# Patient Record
Sex: Male | Born: 1975 | Race: White | Hispanic: No | Marital: Married | Smoking: Never smoker
Health system: Southern US, Community
[De-identification: ages and names within clinical notes are randomized; demographics above are authoritative.]

---

## 2018-09-26 ENCOUNTER — Encounter (HOSPITAL_COMMUNITY): Payer: Self-pay | Admitting: Emergency Medicine

## 2018-09-26 ENCOUNTER — Emergency Department (HOSPITAL_COMMUNITY)
Admission: EM | Admit: 2018-09-26 | Discharge: 2018-09-26 | Disposition: A | Discharge status: Home / Self Care | Payer: 59 | Attending: Emergency Medicine | Admitting: Emergency Medicine

## 2018-09-26 ENCOUNTER — Other Ambulatory Visit: Payer: Self-pay

## 2018-09-26 ENCOUNTER — Emergency Department (HOSPITAL_COMMUNITY): Payer: 59

## 2018-09-26 DIAGNOSIS — R3911 Hesitancy of micturition: Secondary | ICD-10-CM | POA: Diagnosis not present

## 2018-09-26 DIAGNOSIS — Z79899 Other long term (current) drug therapy: Secondary | ICD-10-CM | POA: Diagnosis not present

## 2018-09-26 DIAGNOSIS — R110 Nausea: Secondary | ICD-10-CM | POA: Diagnosis not present

## 2018-09-26 DIAGNOSIS — Z91010 Allergy to peanuts: Secondary | ICD-10-CM | POA: Diagnosis not present

## 2018-09-26 DIAGNOSIS — R197 Diarrhea, unspecified: Secondary | ICD-10-CM | POA: Diagnosis not present

## 2018-09-26 DIAGNOSIS — R1030 Lower abdominal pain, unspecified: Secondary | ICD-10-CM | POA: Diagnosis present

## 2018-09-26 LAB — CBC WITH DIFFERENTIAL/PLATELET
Abs Immature Granulocytes: 0.05 10*3/uL (ref 0.00–0.07)
Basophils Absolute: 0.1 10*3/uL (ref 0.0–0.1)
Basophils Relative: 1 %
Eosinophils Absolute: 0.2 10*3/uL (ref 0.0–0.5)
Eosinophils Relative: 3 %
HCT: 51.6 % (ref 39.0–52.0)
Hemoglobin: 17.7 g/dL — ABNORMAL HIGH (ref 13.0–17.0)
Immature Granulocytes: 1 %
Lymphocytes Relative: 33 %
Lymphs Abs: 2.6 10*3/uL (ref 0.7–4.0)
MCH: 30.9 pg (ref 26.0–34.0)
MCHC: 34.3 g/dL (ref 30.0–36.0)
MCV: 90.2 fL (ref 80.0–100.0)
Monocytes Absolute: 0.7 10*3/uL (ref 0.1–1.0)
Monocytes Relative: 8 %
Neutro Abs: 4.3 10*3/uL (ref 1.7–7.7)
Neutrophils Relative %: 54 %
Platelets: 214 10*3/uL (ref 150–400)
RBC: 5.72 MIL/uL (ref 4.22–5.81)
RDW: 12.3 % (ref 11.5–15.5)
WBC: 7.9 10*3/uL (ref 4.0–10.5)
nRBC: 0 % (ref 0.0–0.2)

## 2018-09-26 LAB — COMPREHENSIVE METABOLIC PANEL
ALT: 36 U/L (ref 0–44)
AST: 27 U/L (ref 15–41)
Albumin: 4.3 g/dL (ref 3.5–5.0)
Alkaline Phosphatase: 81 U/L (ref 38–126)
Anion gap: 11 (ref 5–15)
BUN: 10 mg/dL (ref 6–20)
CO2: 22 mmol/L (ref 22–32)
Calcium: 9.6 mg/dL (ref 8.9–10.3)
Chloride: 107 mmol/L (ref 98–111)
Creatinine, Ser: 0.89 mg/dL (ref 0.61–1.24)
GFR calc Af Amer: >60 mL/min (ref >60)
GFR calc non Af Amer: >60 mL/min (ref >60)
Glucose, Bld: 111 mg/dL — ABNORMAL HIGH (ref 70–99)
Potassium: 4.4 mmol/L (ref 3.5–5.1)
Sodium: 140 mmol/L (ref 135–145)
Total Bilirubin: 0.6 mg/dL (ref 0.3–1.2)
Total Protein: 7.2 g/dL (ref 6.5–8.1)

## 2018-09-26 LAB — URINALYSIS, ROUTINE W REFLEX MICROSCOPIC
Bilirubin Urine: NEGATIVE
Glucose, UA: NEGATIVE mg/dL
Hgb urine dipstick: NEGATIVE
Ketones, ur: NEGATIVE mg/dL
Leukocytes,Ua: NEGATIVE
Nitrite: NEGATIVE
Protein, ur: NEGATIVE mg/dL
Specific Gravity, Urine: 1.011 (ref 1.005–1.030)
pH: 8 (ref 5.0–8.0)

## 2018-09-26 LAB — LIPASE, BLOOD: Lipase: 46 U/L (ref 11–51)

## 2018-09-26 MED ORDER — ONDANSETRON HCL 4 MG/2ML IJ SOLN
4.0000 mg | Freq: Once | INTRAMUSCULAR | Status: AC
  Administered 2018-09-26: 10:00:00 4 mg via INTRAVENOUS
  Filled 2018-09-26: qty 2

## 2018-09-26 MED ORDER — SODIUM CHLORIDE 0.9 % IV BOLUS
1000.0000 mL | Freq: Once | INTRAVENOUS | Status: AC
  Administered 2018-09-26: 1000 mL via INTRAVENOUS

## 2018-09-26 MED ORDER — TAMSULOSIN HCL 0.4 MG PO CAPS: 0.4000 mg | ORAL_CAPSULE | Freq: Every day | ORAL | 0 refills | Status: AC

## 2018-09-26 MED ORDER — KETOROLAC TROMETHAMINE 30 MG/ML IJ SOLN
30.0000 mg | Freq: Once | INTRAMUSCULAR | Status: AC
  Administered 2018-09-26: 30 mg via INTRAVENOUS
  Filled 2018-09-26: qty 1

## 2018-09-26 NOTE — ED Provider Notes (Signed)
[facility] Provider Note   CSN: [id number] Arrival date & time: 09/26/18  [id number]    History   Chief Complaint Chief Complaint  Patient presents with  . Abdominal Pain    HPI Jimmy Vargas is a 43 y.o. male with no significant past medical history presents with due intermittent sharp non radiating lower abdominal pain radiating to right flank onset today at 4am. Patient reports pain woke him up from his sleep. Patient reports he has had similar intermittent episodes over the last 3 months and symptoms typically resolve on their own. Patient reports associated nausea, but denies vomiting. Patient states nothing makes symptoms better or worse. Patient denies taking any medications prior to arrival. Patient reports 1 episode of brown diarrhea. Patient denies fever, chills, cough, shortness of breath, chest pain, or back pain. Patient reports hesitancy with urination at times.  Patient denies dysuria, frequency, or hematuria. Patient reports occasional alcohol use, but denies tobacco or drug use. Patient reports he has had an appendectomy in the past.      HPI  History reviewed. No pertinent past medical history.  There are no active problems to display for this patient.        Home Medications    Prior to Admission medications   Medication Sig Start Date End Date Taking? Authorizing Provider  acetaminophen (TYLENOL) 325 MG tablet Take 650 mg by mouth every 6 (six) hours as needed for mild pain or headache (hip pain).   Yes [provider]  ibuprofen (ADVIL) 200 MG tablet Take 400 mg by mouth every 6 (six) hours as needed for headache or moderate pain (hip pain).   Yes [provider]  Multiple Vitamin (MULTIVITAMIN WITH MINERALS) TABS tablet Take 1 tablet by mouth daily.   Yes [provider]  tamsulosin (FLOMAX) 0.4 MG CAPS capsule Take 1 capsule (0.4 mg total) by mouth daily. 09/26/18   Leretha DykesHernandez, Amelia Burgard P, PA-C     Family History No family history on file.  Social History Social History   Tobacco Use  . Smoking status: Never Smoker  Substance Use Topics  . Alcohol use: Yes    Alcohol/week: 9.0 standard drinks    Types: 9 Glasses of wine per week    Comment: 3 glasses X 3-4/wk  . Drug use: Never     Allergies   Nitrates, organic; Peanut-containing drug products; and Vicodin [hydrocodone-acetaminophen]   Review of Systems Review of Systems  Constitutional: Negative for activity change, appetite change, chills, fever and unexpected weight change.  HENT: Negative for congestion, rhinorrhea and sore throat.   Eyes: Negative for visual disturbance.  Respiratory: Negative for cough and shortness of breath.   Cardiovascular: Negative for chest pain.  Gastrointestinal: Positive for abdominal pain, diarrhea and nausea. Negative for constipation and vomiting.  Endocrine: Negative for polydipsia, polyphagia and polyuria.  Genitourinary: Positive for difficulty urinating. Negative for dysuria, flank pain and frequency.  Musculoskeletal: Negative for back pain.  Skin: Negative for rash.  Allergic/Immunologic: Negative for immunocompromised state.  Psychiatric/Behavioral: The patient is not nervous/anxious.     Physical Exam Updated Vital Signs BP 122/88   Pulse (!) 55   Temp 98 F (36.7 C) (Oral)   Resp 19   Ht 6\' 2"  (1.88 m)   Wt 96.6 kg   SpO2 99%   BMI 27.35 kg/m   Physical Exam Vitals signs and nursing note reviewed.  Constitutional:      General: He is not in acute distress.  Appearance: He is well-developed. He is not diaphoretic.  HENT:     Head: Normocephalic and atraumatic.     Mouth/Throat:     Mouth: Mucous membranes are moist.     Pharynx: No pharyngeal swelling or oropharyngeal exudate.  Eyes:     General: No scleral icterus. Neck:     Musculoskeletal: Normal range of motion and neck supple.  Cardiovascular:     Rate and Rhythm: Normal rate and regular rhythm.      Heart sounds: Normal heart sounds. No murmur. No friction rub. No gallop.   Pulmonary:     Effort: Pulmonary effort is normal. No respiratory distress.     Breath sounds: Normal breath sounds. No wheezing or rales.  Abdominal:     General: Bowel sounds are normal. There is no distension.     Palpations: Abdomen is soft. Abdomen is not rigid. There is no mass.     Tenderness: There is abdominal tenderness in the right lower quadrant and suprapubic area. There is no right CVA tenderness, left CVA tenderness, guarding or rebound.     Hernia: No hernia is present.  Musculoskeletal: Normal range of motion.  Skin:    Findings: No rash.  Neurological:     Mental Status: He is alert and oriented to person, place, and time.    ED Treatments / Results  Labs (all labs ordered are listed, but only abnormal results are displayed) Labs Reviewed  CBC WITH DIFFERENTIAL/PLATELET - Abnormal; Notable for the following components:      Result Value   Hemoglobin 17.7 (*)    All other components within normal limits  COMPREHENSIVE METABOLIC PANEL - Abnormal; Notable for the following components:   Glucose, Bld 111 (*)    All other components within normal limits  LIPASE, BLOOD  URINALYSIS, ROUTINE W REFLEX MICROSCOPIC    EKG None  Radiology Ct Renal Stone Study  Result Date: 09/26/2018 CLINICAL DATA:  Lower abdominal/flank pain EXAM: CT ABDOMEN AND PELVIS WITHOUT CONTRAST TECHNIQUE: Multidetector CT imaging of the abdomen and pelvis was performed following the standard protocol without oral or IV contrast. COMPARISON:  None. FINDINGS: Lower chest: Lung bases are clear. Hepatobiliary: There are scattered subcentimeter cysts in the liver. There is an apparent cyst in the left lobe of the liver measuring 1.4 x 1.1 cm. Gallbladder wall is not appreciably thickened. There is no appreciable biliary duct dilatation. Pancreas: There is no pancreatic mass or inflammatory focus. Spleen: No splenic lesions  are evident. Adrenals/Urinary Tract: Adrenals bilaterally appear unremarkable. Kidneys bilaterally show no evident mass or hydronephrosis on either side. There is a 1 mm calculus in the mid right kidney anteriorly with a second 1 mm calculus more posteriorly in the mid right kidney. There is no evident ureteral calculus on either side. Urinary bladder is midline with wall thickness within normal limits. Stomach/Bowel: There are scattered diverticula throughout the colon without diverticulitis. There is no appreciable bowel wall or mesenteric thickening. There is no evident bowel obstruction. Terminal ileum appears unremarkable. There is no demonstrable free air or portal venous air. Vascular/Lymphatic: There is no appreciable abdominal aortic aneurysm. No vascular lesions are evident on this noncontrast enhanced study. There is no appreciable adenopathy in the abdomen or pelvis. Reproductive: There are small prostatic calculi. The prostate and seminal vesicles are normal in size and contour. No pelvic mass evident. Other: Appendix not appreciable. There is no periappendiceal region inflammation. There is no abscess or ascites in the abdomen or pelvis. There is  thinning of the rectus muscle in the midline at the umbilical level. Musculoskeletal: No blastic or lytic bone lesions. No intramuscular lesions evident. IMPRESSION: 1. 1 mm calculi in the mid right kidney. No hydronephrosis or ureteral calculus on either side. Urinary bladder wall thickness normal. Several small prostatic calculi noted. 2. Multiple colonic diverticula without diverticulitis. No bowel obstruction. No abscess in the abdomen pelvis. No periappendiceal region inflammation. Appendix not appreciable. Electronically Signed   By: Lowella Grip III M.D.   On: 09/26/2018 11:31    Procedures Procedures (including critical care time)  Medications Ordered in ED Medications  ondansetron (ZOFRAN) injection 4 mg (4 mg Intravenous Given 09/26/18  0942)  sodium chloride 0.9 % bolus 1,000 mL (1,000 mLs Intravenous New Bag/Given 09/26/18 0942)  ketorolac (TORADOL) 30 MG/ML injection 30 mg (30 mg Intravenous Given 09/26/18 0942)    Initial Impression / Assessment and Plan / ED Course  I have reviewed the triage vital signs and the nursing notes.  Pertinent labs & imaging results that were available during my care of the patient were reviewed by me and considered in my medical decision making (see chart for details).  Clinical Course as of Sep 26 1207  Thu Sep 26, 2018  0959 WBCs are within normal limits.  WBC: 7.9 [AH]  0959 UA is unremarkable.  Urinalysis, Routine w reflex microscopic [AH]  1035 CMP within normal limits except mild hyperglycemia noted at 111.   Comprehensive metabolic panel(!) [AH]  0354 Lipase within normal limits.  Lipase, blood [AH]  1139 1. 1 mm calculi in the mid right kidney. No hydronephrosis or ureteral calculus on either side. Urinary bladder wall thickness normal. Several small prostatic calculi noted.  2. Multiple colonic diverticula without diverticulitis. No bowel obstruction. No abscess in the abdomen pelvis. No periappendiceal region inflammation. Appendix not appreciable.    CT Renal Stone Study [AH]  1157 Patient reports symptoms have significantly improved while in the ER.    [AH]    Clinical Course User Index [AH] Arville Lime, PA-C      Patient is nontoxic, nonseptic appearing, in no apparent distress.  Patient's pain and other symptoms adequately managed in emergency department.  Fluid bolus, pain medicine, and antiemetics given.  Labs, imaging and vitals reviewed.  Patient does not meet the SIRS or Sepsis criteria.  On repeat exam patient does not have a surgical abdomin and there are no peritoneal signs.  CT stone study reveals 98mm calculi in mid right kidney without hydronephrosis or ureteral calculus. Several small prostatic calculi noted. No indication of appendicitis, bowel  obstruction, bowel perforation, cholecystitis, or diverticulitis. Symptoms have resolved while in the ER. Patient discharged home with symptomatic treatment and given strict instructions for follow-up with their primary care physician and/or urology. Patient refused pain medicine or antiemetics for discharge. I have also discussed reasons to return immediately to the ER.  Patient expresses understanding and agrees with plan.  Final Clinical Impressions(s) / ED Diagnoses   Final diagnoses:  Lower abdominal pain    ED Discharge Orders         Ordered    tamsulosin (FLOMAX) 0.4 MG CAPS capsule  Daily     09/26/18 1206           Darlin Drop Martell, [place] 09/26/18 1210    Noemi Chapel, MD 09/29/18 1531

## 2018-09-26 NOTE — ED Triage Notes (Signed)
PT reports lower abdominal pain that about once a month X3 months. He reports the pain starts at his lower-right quad and then the pain goes into his lower abdomin.  Reports nausea, denies vomiting.

## 2018-09-26 NOTE — Discharge Instructions (Addendum)
You have been seen today for abdominal pain. Please read and follow all provided instructions.   1. Medications: flomax, tylenol/ibuprofen for pain, usual home medications 2. Treatment: rest, drink plenty of fluids 3. Follow Up: Please follow up with your primary doctor in 2-5 days for discussion of your diagnoses and further evaluation after today's visit; if you do not have a primary care doctor use the resource guide provided to find one; Please return to the ER for any new or worsening symptoms. Please obtain all of your results from medical records or have your doctors office obtain the results - share them with your doctor - you should be seen at your doctors office. Call today to arrange your follow up.   Take medications as prescribed. Please review all of the medicines and only take them if you do not have an allergy to them. Return to the emergency room for worsening condition or new concerning symptoms. Follow up with your regular doctor. If you don't have a regular doctor use one of the numbers below to establish a primary care doctor.  Please be aware that if you are taking birth control pills, taking other prescriptions, ESPECIALLY ANTIBIOTICS may make the birth control ineffective - if this is the case, either do not engage in sexual activity or use alternative methods of birth control such as condoms until you have finished the medicine and your family doctor says it is OK to restart them. If you are on a blood thinner such as COUMADIN, be aware that any other medicine that you take may cause the coumadin to either work too much, or not enough - you should have your coumadin level rechecked in next 7 days if this is the case.  ?  It is also a possibility that you have an allergic reaction to any of the medicines that you have been prescribed - Everybody reacts differently to medications and while MOST people have no trouble with most medicines, you may have a reaction such as nausea,  vomiting, rash, swelling, shortness of breath. If this is the case, please stop taking the medicine immediately and contact your physician.  ?  You should return to the ER if you develop severe or worsening symptoms.   Emergency Department Resource Guide 1) Find a Doctor and Pay Out of Pocket Although you won't have to find out who is covered by your insurance plan, it is a good idea to ask around and get recommendations. You will then need to call the office and see if the doctor you have chosen will accept you as a new patient and what types of options they offer for patients who are self-pay. Some doctors offer discounts or will set up payment plans for their patients who do not have insurance, but you will need to ask so you aren't surprised when you get to your appointment.  2) Contact Your Local Health Department Not all health departments have doctors that can see patients for sick visits, but many do, so it is worth a call to see if yours does. If you don't know where your local health department is, you can check in your phone book. The CDC also has a tool to help you locate your state's health department, and many state websites also have listings of all of their local health departments.  3) Find a [facility] If your illness is not likely to be very severe or complicated, you may want to try a walk in clinic. These are  popping up all over the country in pharmacies, drugstores, and shopping centers. They're usually staffed by nurse practitioners or physician assistants that have been trained to treat common illnesses and complaints. They're usually fairly quick and inexpensive. However, if you have serious medical issues or chronic medical problems, these are probably not your best option.  No Primary Care Doctor: Call [facility] Connect at  [phone] - they can help you locate a primary care doctor that  accepts your insurance, provides certain services, etc. Physician Referral Service-  [phone]  Chronic Pain Problems: Organization         Address  Phone   Notes  [facility]  [phone] Patients need to be referred by their primary care doctor.   Medication Assistance: Organization         Address  Phone   Notes  [facility] Medication [facility] [address] [place], [place] [address] [phone] --Must be a resident of [facility] -- Must have NO insurance coverage whatsoever (no Medicaid/ Medicare, etc.) -- The pt. MUST have a primary care doctor that directs their care regularly and follows them in the community   [facility]  [phone]   [employer]  [phone]    Agencies that provide inexpensive medical care: Organization         Address  Phone   Notes  [place]  [phone]   [facility]    [phone]   [facility] [place], [place] [address] [phone]   [place] [address], [place] [phone]   [facility]    [phone]   [facility]    [phone]   [facility] [address], [place] Phone:  [phone], Fax:  [phone] Hours of Operation:  9 am - 6 pm, M-F.  Also accepts Medicaid/Medicare and self-pay.  [facility] for [place] [address], [address], [place] Phone: [phone], Fax: [phone]. Hours of Operation:  8:30 am - 5:30 pm, M-F.  Also accepts Medicaid and self-pay.  [facility] [address] [place] Phone: [phone]   [place], [place], [place] [phone], Ext. [phone] Mondays & Thursdays: 7-9 AM.  First 15 patients are seen on a first come, first serve basis.    [place] Providers:  Organization         Address  Phone   Notes  [facility] [address],  [place] [phone] Also accepts self-pay patients.  [facility] [address], [place]  [phone]   [place], [address], [place] [phone]   [facility] [address] [place] [phone]   Lucianne Lei [address], [place]   [phone] Only accepts [place] patients after they have their name applied to their card.   Self-Pay (no insurance) in [facility]:  Organization         Address  Phone   Notes  Sickle Cell Patients, [facility] [place] [phone]   [facility] [place] [phone]   [facility]  [address] [place]  [address], [place] [phone]   [facility]/Dr. Osei-Bonsu  [address], [place] or [address], [place] [phone] Phone number for both [place] and [place] locations is the same.  [facility] [address], [place] [phone]   [facility] [address] [place] or [address] [phone]   [facility] [address] [place] [phone], phone; [phone], fax Sees patients 1st and 3rd Saturday of every month.  Must not qualify for public or private insurance (i.e. Medicaid, Medicare, [place] Health Choice, Veterans' Benefits)  Household income should be no more than 200% of the poverty level The clinic cannot treat you if you are pregnant or think you are pregnant  Sexually transmitted diseases are not treated at the clinic.

## 2018-09-26 NOTE — ED Notes (Signed)
Patient verbalizes understanding of discharge instructions. Opportunity for questioning and answers were provided. Armband removed by staff, pt discharged from ED.  

## 2020-10-08 IMAGING — CT CT RENAL STONE PROTOCOL
2 of 4 series · 15 of 46 positions shown, 17 images · non-contrast
Comparison: None.

CLINICAL DATA: Lower abdominal/flank pain

EXAM:
CT ABDOMEN AND PELVIS WITHOUT CONTRAST
TECHNIQUE: Multidetector CT imaging of the abdomen and pelvis was performed
following the standard protocol without oral or IV contrast.

[Series 3: renal stone 5.0 · axial · 0.96mm/px · z∈[+688,+1223]mm · 12 of 117 slices shown, 14 images]
[im 5/117  soft-tissue]
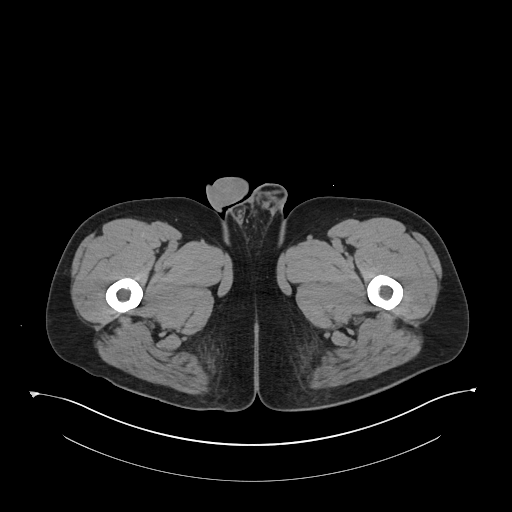
[im 5/117  bone]
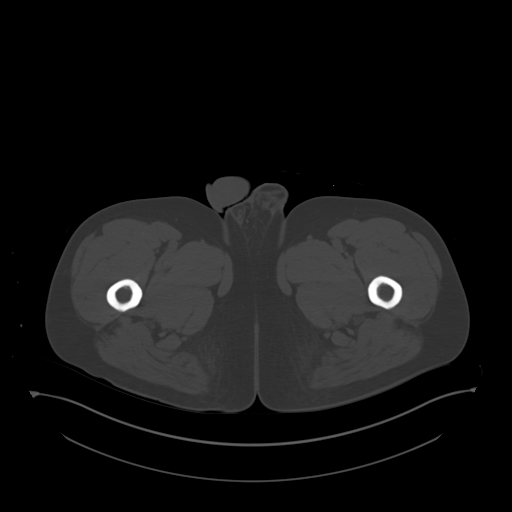
[im 15/117  soft-tissue]
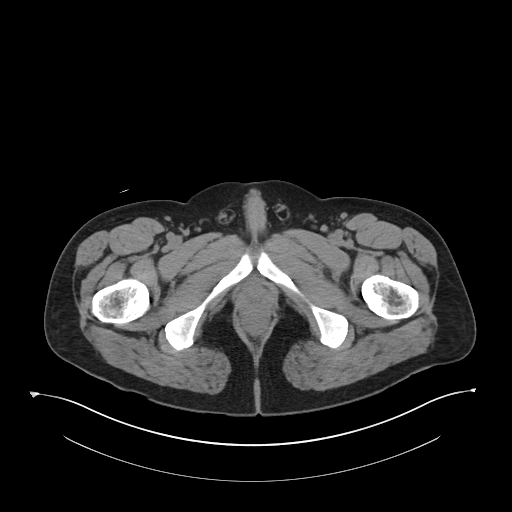
[im 25/117  soft-tissue]
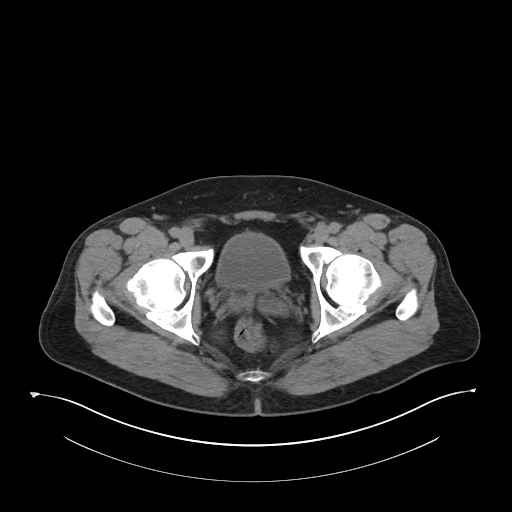
[im 34/117  soft-tissue]
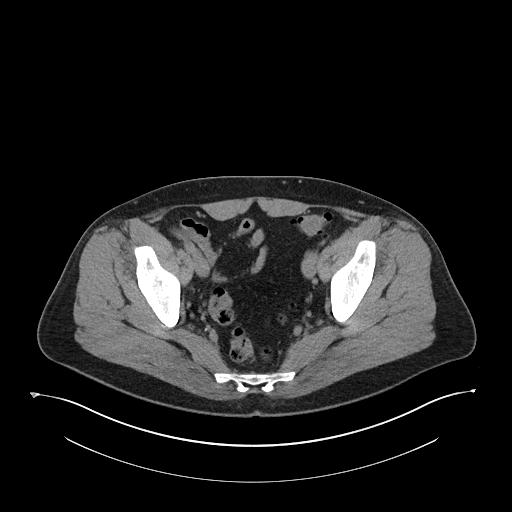
[im 44/117  soft-tissue]
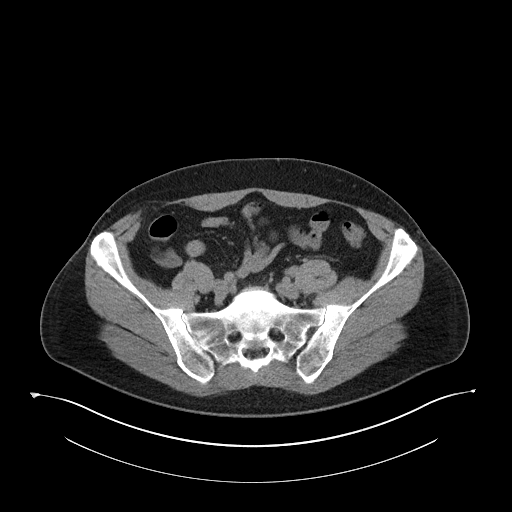
[im 54/117  soft-tissue]
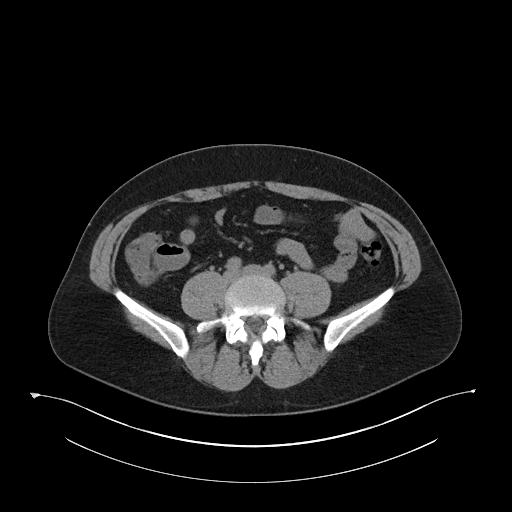
[im 63/117  soft-tissue]
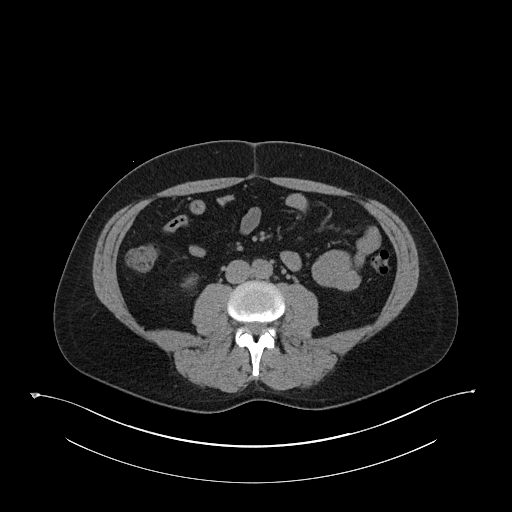
[im 73/117  soft-tissue]
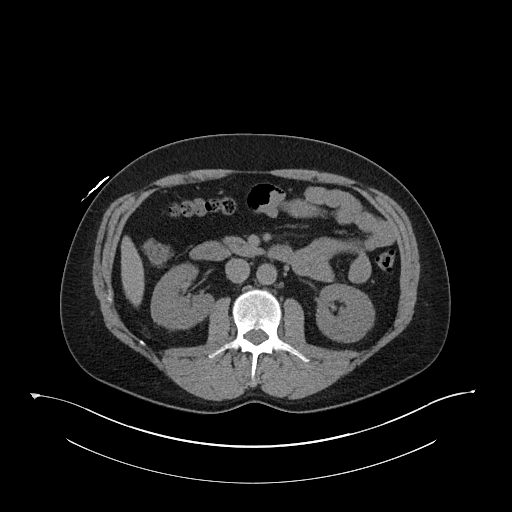
[im 83/117  soft-tissue]
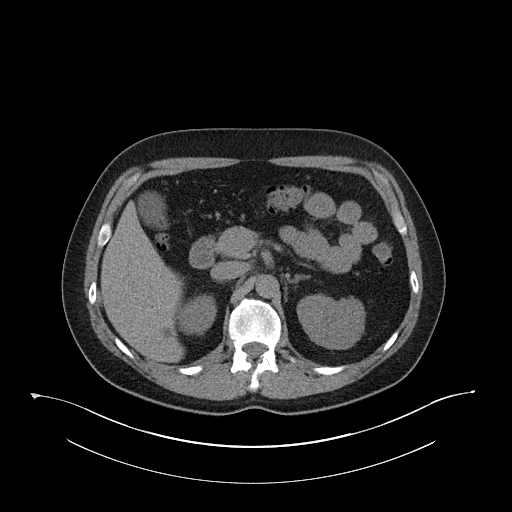
[im 83/117  bone]
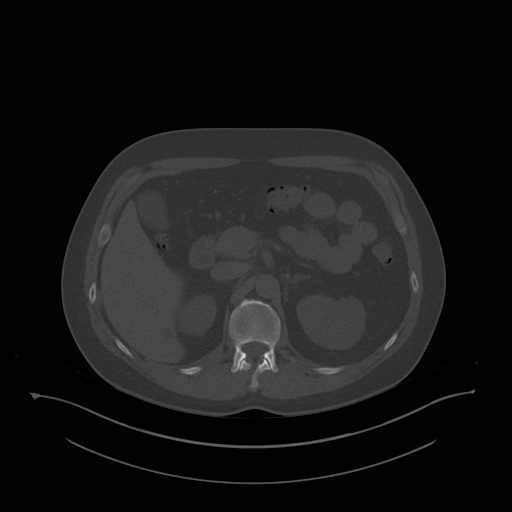
[im 92/117  soft-tissue]
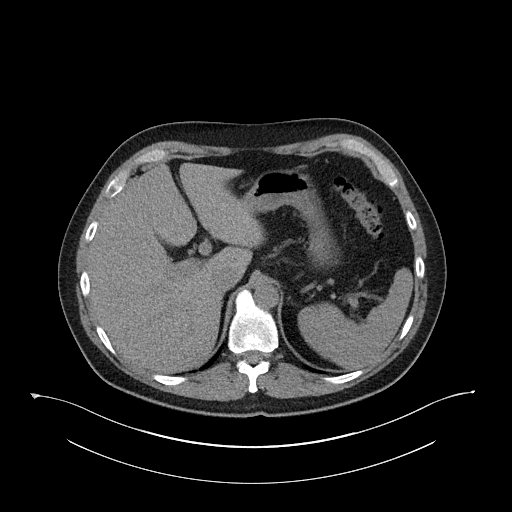
[im 102/117  soft-tissue]
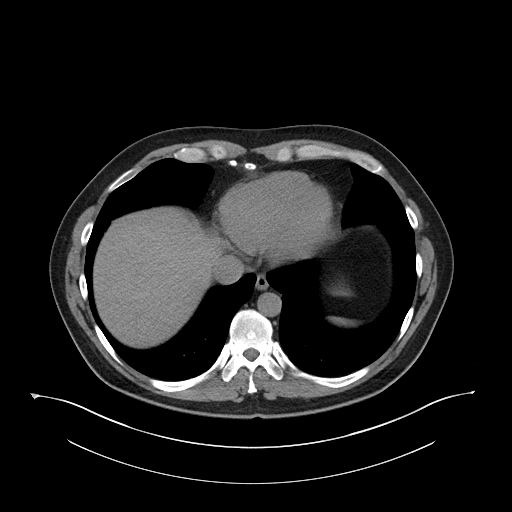
[im 112/117  soft-tissue]
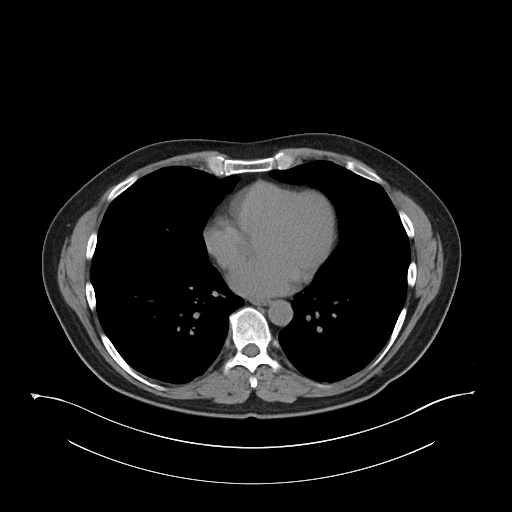

[Series 6: renal stone 3.0 cor · coronal · 0.88mm/px · 3 of 109 slices shown]
[im 37/109  soft-tissue]
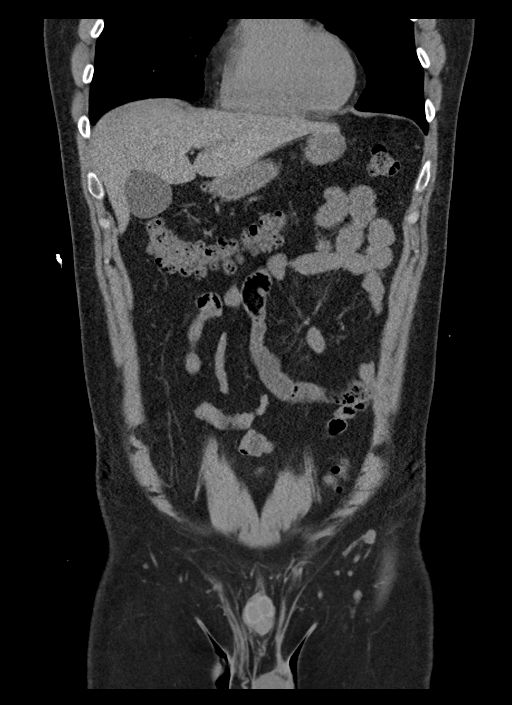
[im 49/109  soft-tissue]
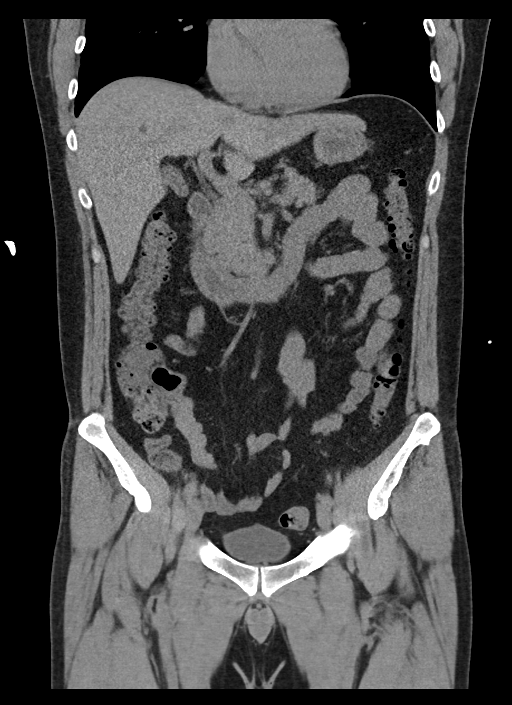
[im 61/109  soft-tissue]
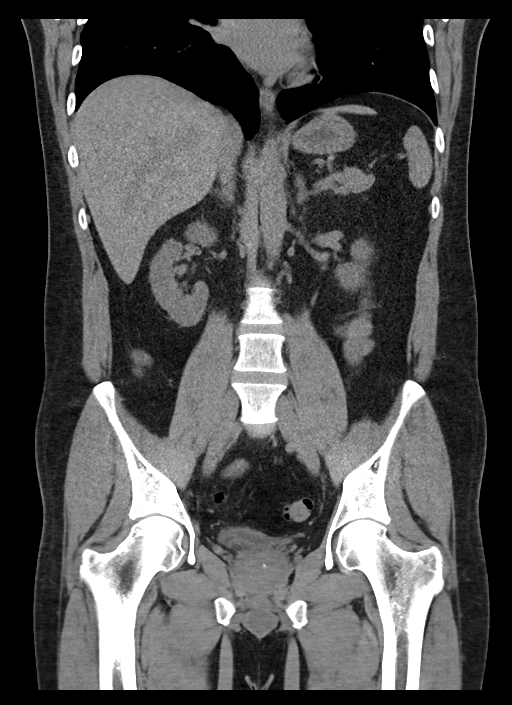

[15 of 46 positions shown; findings below may reference images not displayed]

FINDINGS: Lower chest: Lung bases are clear.

Hepatobiliary: There are scattered subcentimeter cysts in the liver.
There is an apparent cyst in the left lobe of the liver measuring
1.4 x 1.1 cm. Gallbladder wall is not appreciably thickened. There
is no appreciable biliary duct dilatation.

Pancreas: There is no pancreatic mass or inflammatory focus.

Spleen: No splenic lesions are evident.

Adrenals/Urinary Tract: Adrenals bilaterally appear unremarkable.
Kidneys bilaterally show no evident mass or hydronephrosis on either
side. There is a 1 mm calculus in the mid right kidney anteriorly
with a second 1 mm calculus more posteriorly in the mid right
kidney. There is no evident ureteral calculus on either side.
Urinary bladder is midline with wall thickness within normal limits.

Stomach/Bowel: There are scattered diverticula throughout the colon
without diverticulitis. There is no appreciable bowel wall or
mesenteric thickening. There is no evident bowel obstruction.
Terminal ileum appears unremarkable. There is no demonstrable free
air or portal venous air.

Vascular/Lymphatic: There is no appreciable abdominal aortic
aneurysm. No vascular lesions are evident on this noncontrast
enhanced study. There is no appreciable adenopathy in the abdomen or
pelvis.

Reproductive: There are small prostatic calculi. The prostate and
seminal vesicles are normal in size and contour. No pelvic mass
evident.

Other: Appendix not appreciable. There is no periappendiceal region
inflammation. There is no abscess or ascites in the abdomen or
pelvis. There is thinning of the rectus muscle in the midline at the
umbilical level.

Musculoskeletal: No blastic or lytic bone lesions. No intramuscular
lesions evident.
IMPRESSION: 1. 1 mm calculi in the mid right kidney. No hydronephrosis or
ureteral calculus on either side. Urinary bladder wall thickness
normal. Several small prostatic calculi noted.

2. Multiple colonic diverticula without diverticulitis. No bowel
obstruction. No abscess in the abdomen pelvis. No periappendiceal
region inflammation. Appendix not appreciable.
